# Patient Record
Sex: Female | Born: 2005 | Race: White | Hispanic: No | Marital: Single
Health system: Southern US, Community
[De-identification: ages and names within clinical notes are randomized; demographics above are authoritative.]

## PROBLEM LIST (undated history)

## (undated) DIAGNOSIS — J353 Hypertrophy of tonsils with hypertrophy of adenoids: Secondary | ICD-10-CM

## (undated) DIAGNOSIS — R0989 Other specified symptoms and signs involving the circulatory and respiratory systems: Secondary | ICD-10-CM

## (undated) DIAGNOSIS — H52209 Unspecified astigmatism, unspecified eye: Secondary | ICD-10-CM

---
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
## 2024-07-10 ENCOUNTER — Encounter (HOSPITAL_BASED_OUTPATIENT_CLINIC_OR_DEPARTMENT_OTHER): Payer: Self-pay | Admitting: Emergency Medicine

## 2024-07-10 ENCOUNTER — Ambulatory Visit (HOSPITAL_BASED_OUTPATIENT_CLINIC_OR_DEPARTMENT_OTHER)
Admission: EM | Admit: 2024-07-10 | Discharge: 2024-07-10 | Disposition: A | Discharge status: Home / Self Care | Attending: Family Medicine | Admitting: Family Medicine

## 2024-07-10 DIAGNOSIS — Z20828 Contact with and (suspected) exposure to other viral communicable diseases: Secondary | ICD-10-CM

## 2024-07-10 DIAGNOSIS — R5383 Other fatigue: Secondary | ICD-10-CM

## 2024-07-10 DIAGNOSIS — K047 Periapical abscess without sinus: Secondary | ICD-10-CM

## 2024-07-10 LAB — POCT MONO SCREEN (KUC): Mono, POC: NEGATIVE

## 2024-07-10 MED ORDER — AMOXICILLIN-POT CLAVULANATE 875-125 MG PO TABS: 1.0000 | ORAL_TABLET | Freq: Two times a day (BID) | ORAL | 0 refills | Status: AC

## 2024-07-10 MED ORDER — FLUCONAZOLE 150 MG PO TABS: ORAL_TABLET | ORAL | 0 refills | Status: AC

## 2024-07-10 NOTE — ED Provider Notes (Addendum)
 Kim Nelson    CSN: [id number] Arrival date & time: 07/10/24  1740      History   Chief Complaint Chief Complaint  Patient presents with   Dental Pain    HPI Kim Nelson is a 18 y.o. female.   18 year old female here with her mother.  Patient and the mother are providing medical history.  Patient has had left lower molar dental pain that started on approximately 07/02/2024 or earlier.  She is trying to get into see her dentist.  She worries that either wisdom tooth is breaking through where she has some kind of an infection.  She thinks she has some swollen glands in her neck or at least her neck is tender also.  Her mother is concerned because the patient's sister had mononucleosis approximately 3 to 4 weeks ago.  The mother is concerned with mouth pain and fatigue that the patient might have mono.  Patient reports she did feel more tired 2 or 3 weeks ago and feels some better now but she is still fatigued.   Dental Pain Associated symptoms: no fever     Past Medical History:  Diagnosis Date   Astigmatism    Runny nose 09/26/2017   green drainage from nose, per mother   Tonsillar and adenoid hypertrophy 09/2017   snores during sleep, mother denies apnea    There are no active problems to display for this patient.   Past Surgical History:  Procedure Laterality Date   CLOSED REDUCTION RADIAL SHAFT Left 03/20/2014   Procedure: CLOSED REDUCTION RADIAL SHAFT WITH LONG ARM CAST;  Surgeon: Prentice LELON Pagan, MD;  Location: [facility] OR;  Service: Orthopedics;  Laterality: Left;   TONSILLECTOMY AND ADENOIDECTOMY N/A 10/02/2017   Procedure: TONSILLECTOMY AND ADENOIDECTOMY;  Surgeon: Karis Clunes, MD;  Location: [facility];  Service: ENT;  Laterality: N/A;    OB History   No obstetric history on file.      Home Medications    Prior to Admission medications   Medication Sig Start Date End Date Taking? Authorizing Provider  amoxicillin -clavulanate (AUGMENTIN )  875-125 MG tablet Take 1 tablet by mouth 2 (two) times daily after a meal for 7 days. 07/10/24 07/17/24 Yes Ival Domino, FNP  fluconazole  (DIFLUCAN ) 150 MG tablet By mouth today and repeat in 5-7 days for vaginal yeast infection. 07/10/24  Yes Ival Domino, FNP  busPIRone  (BUSPAR ) 15 MG tablet Take 1/3 of a tablet  by mouth 3 (three) times daily as needed for anxiety 02/15/22     famotidine  (PEPCID ) 40 MG tablet Take 1 tablet (40 mg total) by mouth daily. 02/15/22     metFORMIN  (GLUCOPHAGE -XR) 750 MG 24 hr tablet Take 1 tablet (750 mg total) by mouth daily. 02/04/21     norethindrone -ethinyl estradiol -FE (JUNEL  FE 1/20) 1-20 MG-MCG tablet Take 1 tablet by mouth daily. 07/06/21     oxyCODONE  (ROXICODONE ) 5 MG/5ML solution Take 5-10 mLs (5-10 mg total) by mouth every 4 (four) hours as needed for severe pain. 10/02/17   Karis Clunes, MD  Pediatric Multiple Vit-C-FA (MULTIVITAMIN CHILDRENS PO) Take by mouth.    [provider]    Family History Family History  Problem Relation Age of Onset   Diabetes Maternal Grandmother    Hypertension Maternal Grandmother    Stroke Maternal Grandmother    Diabetes Maternal Grandfather    Diabetes Paternal Grandfather    Heart disease Paternal Grandfather     Social History Social History   Tobacco Use  Smoking status: Passive Smoke Exposure - Never Smoker   Smokeless tobacco: Never   Tobacco comments:    father smokes outside  Vaping Use   Vaping status: Never Used  Substance Use Topics   Alcohol use: No   Drug use: No     Allergies   Patient has no known allergies.   Review of Systems Review of Systems  Constitutional:  Positive for fatigue. Negative for chills and fever.  HENT:  Positive for dental problem. Negative for ear pain and sore throat.   Eyes:  Negative for pain and visual disturbance.  Respiratory:  Negative for cough and shortness of breath.   Cardiovascular:  Negative for chest pain and palpitations.  Gastrointestinal:   Negative for abdominal pain, constipation, diarrhea, nausea and vomiting.  Genitourinary:  Negative for dysuria and hematuria.  Musculoskeletal:  Negative for arthralgias and back pain.  Skin:  Negative for color change and rash.  Neurological:  Negative for seizures and syncope.  Hematological:  Positive for adenopathy.  All other systems reviewed and are negative.    Physical Exam Triage Vital Signs ED Triage Vitals  Encounter Vitals Group     BP 07/10/24 1803 122/83     Girls Systolic BP Percentile --      Girls Diastolic BP Percentile --      Boys Systolic BP Percentile --      Boys Diastolic BP Percentile --      Pulse Rate 07/10/24 1803 81     Resp 07/10/24 1803 18     Temp 07/10/24 1803 97.8 F (36.6 C)     Temp Source 07/10/24 1803 Oral     SpO2 07/10/24 1803 97 %     Weight --      Height --      Head Circumference --      Peak Flow --      Pain Score 07/10/24 1802 6     Pain Loc --      Pain Education --      Exclude from Growth Chart --    No data found.  Updated Vital Signs BP 122/83 (BP Location: Right Arm)   Pulse 81   Temp 97.8 F (36.6 C) (Oral)   Resp 18   LMP 01/29/2024   SpO2 97%   Visual Acuity Right Eye Distance:   Left Eye Distance:   Bilateral Distance:    Right Eye Near:   Left Eye Near:    Bilateral Near:     Physical Exam Vitals and nursing note reviewed.  Constitutional:      General: She is not in acute distress.    Appearance: She is well-developed. She is not ill-appearing, toxic-appearing or diaphoretic.  HENT:     Head: Normocephalic and atraumatic.     Right Ear: Hearing, tympanic membrane, ear canal and external ear normal.     Left Ear: Hearing, tympanic membrane, ear canal and external ear normal.     Nose: No congestion or rhinorrhea.     Right Sinus: No maxillary sinus tenderness or frontal sinus tenderness.     Left Sinus: No maxillary sinus tenderness or frontal sinus tenderness.     Mouth/Throat:     Lips:  Pink.     Mouth: Mucous membranes are moist.     Dentition: Abnormal dentition. Dental tenderness and gingival swelling present. No dental caries or dental abscesses.     Pharynx: Uvula midline. No oropharyngeal exudate or posterior oropharyngeal erythema.  Tonsils: No tonsillar exudate.     Comments: Left lower jaw with swelling and erythema of the gum past the last tooth.  No exudate and no clear abscess noted.  Area is tender when probed. Eyes:     Conjunctiva/sclera: Conjunctivae normal.     Pupils: Pupils are equal, round, and reactive to light.  Cardiovascular:     Rate and Rhythm: Normal rate and regular rhythm.     Heart sounds: S1 normal and S2 normal. No murmur heard. Pulmonary:     Effort: Pulmonary effort is normal. No respiratory distress.     Breath sounds: Normal breath sounds. No decreased breath sounds, wheezing, rhonchi or rales.  Abdominal:     General: Bowel sounds are normal.     Palpations: Abdomen is soft.     Tenderness: There is no abdominal tenderness. There is no right CVA tenderness, left CVA tenderness, guarding or rebound. Negative signs include Murphy's sign, Rovsing's sign and McBurney's sign.  Musculoskeletal:        General: No swelling.     Cervical back: Neck supple.  Lymphadenopathy:     Head:     Right side of head: No submental, submandibular, tonsillar, preauricular or posterior auricular adenopathy.     Left side of head: No submental, submandibular, tonsillar, preauricular or posterior auricular adenopathy.     Cervical: No cervical adenopathy.     Right cervical: No superficial, deep or posterior cervical adenopathy.    Left cervical: No superficial, deep or posterior cervical adenopathy.     Upper Body:     Right upper body: No supraclavicular or axillary adenopathy.     Left upper body: No supraclavicular or axillary adenopathy.     Lower Body: No right inguinal adenopathy. No left inguinal adenopathy.  Skin:    General: Skin is warm  and dry.     Capillary Refill: Capillary refill takes less than 2 seconds.     Findings: No rash.  Neurological:     Mental Status: She is alert and oriented to person, place, and time.  Psychiatric:        Mood and Affect: Mood normal.      UC Treatments / Results  Labs (all labs ordered are listed, but only abnormal results are displayed) Labs Reviewed  POCT MONO SCREEN [facility]) - Normal    EKG   Radiology No results found.  Procedures Procedures (including critical Nelson time)  Medications Ordered in UC Medications - No data to display  Initial Impression / Assessment and Plan / UC Course  I have reviewed the triage vital signs and the nursing notes.  Pertinent labs & imaging results that were available during my Nelson of the patient were reviewed by me and considered in my medical decision making (see chart for details).  Plan of Nelson: Dental infection: Augmentin  875-125 mg, 1 pill twice daily for 7 days.  Take the Augmentin  after food.  Use fluconazole , 150 mg, 1 pill now and repeat in 5 to 7 days if needed for vaginal yeast infection.  Encouraged use of probiotics to prevent antibiotic related diarrhea.  Exposure to mononucleosis and fatigue: Exam is essentially normal or negative.  Monospot was negative.  Follow-up with primary Nelson as needed  Return here as needed.  I reviewed the plan of Nelson with the patient and/or the patient's guardian.  The patient and/or guardian had time to ask questions and acknowledged that the questions were answered.  I provided instruction on symptoms or reasons  to return here or to go to an ER, if symptoms/condition did not improve, worsened or if new symptoms occurred.  Final Clinical Impressions(s) / UC Diagnoses   Final diagnoses:  Other fatigue  Exposure to mononucleosis syndrome  Dental infection     Discharge Instructions      Dental infection: Augmentin  875-125 mg, 1 pill twice daily for 7 days.  Take the Augmentin  after  food.  Use fluconazole , 150 mg, 1 pill now and repeat in 5 to 7 days if needed for vaginal yeast infection.  Encouraged use of probiotics to prevent antibiotic related diarrhea.  Exposure to mononucleosis and fatigue: Exam is essentially normal or negative.  Monospot was negative.  Follow-up with primary Nelson as needed  Return here as needed.     ED Prescriptions     Medication Sig Dispense Auth. Provider   amoxicillin -clavulanate (AUGMENTIN ) 875-125 MG tablet Take 1 tablet by mouth 2 (two) times daily after a meal for 7 days. 14 tablet Shia Eber, FNP   fluconazole  (DIFLUCAN ) 150 MG tablet By mouth today and repeat in 5-7 days for vaginal yeast infection. 2 tablet Jalyah Weinheimer, FNP      PDMP not reviewed this encounter.   Ival Domino, FNP 07/10/24 1910    Ival Domino, FNP 07/15/24 1031

## 2024-07-10 NOTE — ED Provider Notes (Incomplete Revision)
 [facility]    CSN: [id number] Arrival date & time: 07/10/24  1740      History   Chief Complaint Chief Complaint  Patient presents with   Dental Pain    HPI Kim Nelson is a 18 y.o. female.   18 year old female here with her mother.  Patient and the mother are providing medical history.  Patient has had left lower molar dental pain that started on approximately 07/02/2024 or earlier.  She is trying to get into see her dentist.  She worries that either wisdom tooth is breaking through where she has some kind of an infection.  She thinks she has some swollen glands in her neck or at least her neck is tender also.  Her mother is concerned because the patient's sister had mononucleosis approximately 3 to 4 weeks ago.  The mother is concerned with mouth pain and fatigue that the patient might have mono.  Patient reports she did feel more tired 2 or 3 weeks ago and feels some better now but she is still fatigued.   Dental Pain Associated symptoms: no fever     Past Medical History:  Diagnosis Date   Astigmatism    Runny nose 09/26/2017   green drainage from nose, per mother   Tonsillar and adenoid hypertrophy 09/2017   snores during sleep, mother denies apnea    There are no active problems to display for this patient.   Past Surgical History:  Procedure Laterality Date   CLOSED REDUCTION RADIAL SHAFT Left 03/20/2014   Procedure: CLOSED REDUCTION RADIAL SHAFT WITH LONG ARM CAST;  Surgeon: Prentice LELON Pagan, MD;  Location: [facility] OR;  Service: Orthopedics;  Laterality: Left;   TONSILLECTOMY AND ADENOIDECTOMY N/A 10/02/2017   Procedure: TONSILLECTOMY AND ADENOIDECTOMY;  Surgeon: Karis Clunes, MD;  Location: [facility];  Service: ENT;  Laterality: N/A;    OB History   No obstetric history on file.      Home Medications    Prior to Admission medications   Medication Sig Start Date End Date Taking? Authorizing Provider  amoxicillin -clavulanate (AUGMENTIN )  875-125 MG tablet Take 1 tablet by mouth 2 (two) times daily after a meal for 7 days. 07/10/24 07/17/24 Yes Ival Domino, FNP  fluconazole  (DIFLUCAN ) 150 MG tablet By mouth today and repeat in 5-7 days for vaginal yeast infection. 07/10/24  Yes Ival Domino, FNP  busPIRone  (BUSPAR ) 15 MG tablet Take 1/3 of a tablet  by mouth 3 (three) times daily as needed for anxiety 02/15/22     famotidine  (PEPCID ) 40 MG tablet Take 1 tablet (40 mg total) by mouth daily. 02/15/22     metFORMIN  (GLUCOPHAGE -XR) 750 MG 24 hr tablet Take 1 tablet (750 mg total) by mouth daily. 02/04/21     norethindrone -ethinyl estradiol -FE (JUNEL  FE 1/20) 1-20 MG-MCG tablet Take 1 tablet by mouth daily. 07/06/21     oxyCODONE  (ROXICODONE ) 5 MG/5ML solution Take 5-10 mLs (5-10 mg total) by mouth every 4 (four) hours as needed for severe pain. 10/02/17   Karis Clunes, MD  Pediatric Multiple Vit-C-FA (MULTIVITAMIN CHILDRENS PO) Take by mouth.    [provider]    Family History Family History  Problem Relation Age of Onset   Diabetes Maternal Grandmother    Hypertension Maternal Grandmother    Stroke Maternal Grandmother    Diabetes Maternal Grandfather    Diabetes Paternal Grandfather    Heart disease Paternal Grandfather     Social History Social History   Tobacco Use  Smoking status: Passive Smoke Exposure - Never Smoker   Smokeless tobacco: Never   Tobacco comments:    father smokes outside  Vaping Use   Vaping status: Never Used  Substance Use Topics   Alcohol use: No   Drug use: No     Allergies   Patient has no known allergies.   Review of Systems Review of Systems  Constitutional:  Positive for fatigue. Negative for chills and fever.  HENT:  Positive for dental problem. Negative for ear pain and sore throat.   Eyes:  Negative for pain and visual disturbance.  Respiratory:  Negative for cough and shortness of breath.   Cardiovascular:  Negative for chest pain and palpitations.  Gastrointestinal:   Negative for abdominal pain, constipation, diarrhea, nausea and vomiting.  Genitourinary:  Negative for dysuria and hematuria.  Musculoskeletal:  Negative for arthralgias and back pain.  Skin:  Negative for color change and rash.  Neurological:  Negative for seizures and syncope.  Hematological:  Positive for adenopathy.  All other systems reviewed and are negative.    Physical Exam Triage Vital Signs ED Triage Vitals  Encounter Vitals Group     BP 07/10/24 1803 122/83     Girls Systolic BP Percentile --      Girls Diastolic BP Percentile --      Boys Systolic BP Percentile --      Boys Diastolic BP Percentile --      Pulse Rate 07/10/24 1803 81     Resp 07/10/24 1803 18     Temp 07/10/24 1803 97.8 F (36.6 C)     Temp Source 07/10/24 1803 Oral     SpO2 07/10/24 1803 97 %     Weight --      Height --      Head Circumference --      Peak Flow --      Pain Score 07/10/24 1802 6     Pain Loc --      Pain Education --      Exclude from Growth Chart --    No data found.  Updated Vital Signs BP 122/83 (BP Location: Right Arm)   Pulse 81   Temp 97.8 F (36.6 C) (Oral)   Resp 18   LMP 01/29/2024   SpO2 97%   Visual Acuity Right Eye Distance:   Left Eye Distance:   Bilateral Distance:    Right Eye Near:   Left Eye Near:    Bilateral Near:     Physical Exam Vitals and nursing note reviewed.  Constitutional:      General: She is not in acute distress.    Appearance: She is well-developed. She is not ill-appearing, toxic-appearing or diaphoretic.  HENT:     Head: Normocephalic and atraumatic.     Right Ear: Hearing, tympanic membrane, ear canal and external ear normal.     Left Ear: Hearing, tympanic membrane, ear canal and external ear normal.     Nose: No congestion or rhinorrhea.     Right Sinus: No maxillary sinus tenderness or frontal sinus tenderness.     Left Sinus: No maxillary sinus tenderness or frontal sinus tenderness.     Mouth/Throat:     Lips:  Pink.     Mouth: Mucous membranes are moist.     Dentition: Abnormal dentition. Dental tenderness and gingival swelling present. No dental caries or dental abscesses.     Pharynx: Uvula midline. No oropharyngeal exudate or posterior oropharyngeal erythema.  Tonsils: No tonsillar exudate.     Comments: Left lower jaw with swelling and erythema of the gum past the last tooth.  No exudate and no clear abscess noted.  Area is tender when probed. Eyes:     Conjunctiva/sclera: Conjunctivae normal.     Pupils: Pupils are equal, round, and reactive to light.  Cardiovascular:     Rate and Rhythm: Normal rate and regular rhythm.     Heart sounds: S1 normal and S2 normal. No murmur heard. Pulmonary:     Effort: Pulmonary effort is normal. No respiratory distress.     Breath sounds: Normal breath sounds. No decreased breath sounds, wheezing, rhonchi or rales.  Abdominal:     General: Bowel sounds are normal.     Palpations: Abdomen is soft.     Tenderness: There is no abdominal tenderness. There is no right CVA tenderness, left CVA tenderness, guarding or rebound. Negative signs include Murphy's sign, Rovsing's sign and McBurney's sign.  Musculoskeletal:        General: No swelling.     Cervical back: Neck supple.  Lymphadenopathy:     Head:     Right side of head: No submental, submandibular, tonsillar, preauricular or posterior auricular adenopathy.     Left side of head: No submental, submandibular, tonsillar, preauricular or posterior auricular adenopathy.     Cervical: No cervical adenopathy.     Right cervical: No superficial, deep or posterior cervical adenopathy.    Left cervical: No superficial, deep or posterior cervical adenopathy.     Upper Body:     Right upper body: No supraclavicular or axillary adenopathy.     Left upper body: No supraclavicular or axillary adenopathy.     Lower Body: No right inguinal adenopathy. No left inguinal adenopathy.  Skin:    General: Skin is warm  and dry.     Capillary Refill: Capillary refill takes less than 2 seconds.     Findings: No rash.  Neurological:     Mental Status: She is alert and oriented to person, place, and time.  Psychiatric:        Mood and Affect: Mood normal.      UC Treatments / Results  Labs (all labs ordered are listed, but only abnormal results are displayed) Labs Reviewed  POCT MONO SCREEN [facility]) - Normal    EKG   Radiology No results found.  Procedures Procedures (including critical care time)  Medications Ordered in UC Medications - No data to display  Initial Impression / Assessment and Plan / UC Course  I have reviewed the triage vital signs and the nursing notes.  Pertinent labs & imaging results that were available during my care of the patient were reviewed by me and considered in my medical decision making (see chart for details).  Plan of Care:    I reviewed the plan of care with the patient and/or the patient's guardian.  The patient and/or guardian had time to ask questions and acknowledged that the questions were answered.  I provided instruction on symptoms or reasons to return here or to go to an ER, if symptoms/condition did not improve, worsened or if new symptoms occurred.  Final Clinical Impressions(s) / UC Diagnoses   Final diagnoses:  Other fatigue  Exposure to mononucleosis syndrome  Dental infection     Discharge Instructions      Dental infection: Augmentin  875-125 mg, 1 pill twice daily for 7 days.  Take the Augmentin  after food.  Use fluconazole , 150  mg, 1 pill now and repeat in 5 to 7 days if needed for vaginal yeast infection.  Encouraged use of probiotics to prevent antibiotic related diarrhea.  Exposure to mononucleosis and fatigue: Exam is essentially normal or negative.  Monospot was negative.  Follow-up with primary care as needed  Return here as needed.     ED Prescriptions     Medication Sig Dispense Auth. Provider    amoxicillin -clavulanate (AUGMENTIN ) 875-125 MG tablet Take 1 tablet by mouth 2 (two) times daily after a meal for 7 days. 14 tablet Kleber Crean, FNP   fluconazole  (DIFLUCAN ) 150 MG tablet By mouth today and repeat in 5-7 days for vaginal yeast infection. 2 tablet Parag Dorton, FNP      PDMP not reviewed this encounter.   Ival Domino, FNP 07/10/24 1910

## 2024-07-10 NOTE — ED Triage Notes (Signed)
 Pt c/o wisdom tooth pain to her lower left side started last week but getting worse since Monday pt also thinks her lymph nodes are swollen on that same side, pt will call tomorrow to schedule appointment with oral surgeon

## 2024-07-10 NOTE — Discharge Instructions (Addendum)
 Dental infection: Augmentin  875-125 mg, 1 pill twice daily for 7 days.  Take the Augmentin  after food.  Use fluconazole , 150 mg, 1 pill now and repeat in 5 to 7 days if needed for vaginal yeast infection.  Encouraged use of probiotics to prevent antibiotic related diarrhea.  Exposure to mononucleosis and fatigue: Exam is essentially normal or negative.  Monospot was negative.  Follow-up with primary care as needed  Return here as needed.

## 2024-07-11 DIAGNOSIS — Z131 Encounter for screening for diabetes mellitus: Secondary | ICD-10-CM | POA: Diagnosis not present

## 2024-07-11 DIAGNOSIS — Z1331 Encounter for screening for depression: Secondary | ICD-10-CM | POA: Diagnosis not present

## 2024-07-11 DIAGNOSIS — Z0000 Encounter for general adult medical examination without abnormal findings: Secondary | ICD-10-CM | POA: Diagnosis not present

## 2024-07-11 DIAGNOSIS — Z111 Encounter for screening for respiratory tuberculosis: Secondary | ICD-10-CM | POA: Diagnosis not present

## 2024-07-11 DIAGNOSIS — Z1339 Encounter for screening examination for other mental health and behavioral disorders: Secondary | ICD-10-CM | POA: Diagnosis not present

## 2024-07-11 DIAGNOSIS — Z13220 Encounter for screening for lipoid disorders: Secondary | ICD-10-CM | POA: Diagnosis not present

## 2024-07-11 DIAGNOSIS — E049 Nontoxic goiter, unspecified: Secondary | ICD-10-CM | POA: Diagnosis not present

## 2024-07-12 DIAGNOSIS — E049 Nontoxic goiter, unspecified: Secondary | ICD-10-CM | POA: Diagnosis not present

## 2024-07-29 DIAGNOSIS — R070 Pain in throat: Secondary | ICD-10-CM | POA: Diagnosis not present

## 2024-07-29 DIAGNOSIS — H9203 Otalgia, bilateral: Secondary | ICD-10-CM | POA: Diagnosis not present

## 2024-07-29 DIAGNOSIS — R6883 Chills (without fever): Secondary | ICD-10-CM | POA: Diagnosis not present

## 2024-07-29 DIAGNOSIS — M791 Myalgia, unspecified site: Secondary | ICD-10-CM | POA: Diagnosis not present
